# Patient Record
Sex: Female | Born: 1965 | Race: White | Hispanic: No | Smoking: Current every day smoker
Health system: Southern US, Community
[De-identification: ages and names within clinical notes are randomized; demographics above are authoritative.]

## PROBLEM LIST (undated history)

## (undated) DIAGNOSIS — N926 Irregular menstruation, unspecified: Secondary | ICD-10-CM

## (undated) DIAGNOSIS — Z8669 Personal history of other diseases of the nervous system and sense organs: Secondary | ICD-10-CM

## (undated) DIAGNOSIS — R6889 Other general symptoms and signs: Secondary | ICD-10-CM

## (undated) DIAGNOSIS — K219 Gastro-esophageal reflux disease without esophagitis: Secondary | ICD-10-CM

## (undated) HISTORY — DX: Irregular menstruation, unspecified: N92.6

## (undated) HISTORY — PX: TUBAL LIGATION: SHX77

## (undated) HISTORY — DX: Personal history of other diseases of the nervous system and sense organs: Z86.69

## (undated) HISTORY — DX: Gastro-esophageal reflux disease without esophagitis: K21.9

## (undated) HISTORY — DX: Other general symptoms and signs: R68.89

---

## 2004-01-14 ENCOUNTER — Ambulatory Visit (HOSPITAL_COMMUNITY): Admission: RE | Admit: 2004-01-14 | Discharge: 2004-01-14 | Payer: Self-pay | Admitting: Orthopaedic Surgery

## 2009-09-23 LAB — LIPID PANEL: LDL Cholesterol: 123 mg/dL

## 2009-09-23 LAB — BASIC METABOLIC PANEL: Creatinine: 1 mg/dL (ref <=1.1)

## 2009-09-30 ENCOUNTER — Encounter: Admission: RE | Admit: 2009-09-30 | Discharge: 2009-09-30 | Payer: Self-pay | Admitting: Family Medicine

## 2009-11-29 ENCOUNTER — Encounter: Admission: RE | Admit: 2009-11-29 | Discharge: 2009-11-29 | Payer: Self-pay | Admitting: Family Medicine

## 2009-12-15 LAB — CBC AND DIFFERENTIAL: WBC: 7.2 10^3/mL

## 2012-05-30 ENCOUNTER — Telehealth: Payer: Self-pay | Admitting: Family Medicine

## 2012-05-30 ENCOUNTER — Encounter: Payer: Self-pay | Admitting: Family Medicine

## 2012-05-30 ENCOUNTER — Ambulatory Visit (INDEPENDENT_AMBULATORY_CARE_PROVIDER_SITE_OTHER): Payer: BC Managed Care – PPO | Admitting: Family Medicine

## 2012-05-30 VITALS — BP 115/74 | HR 99 | Temp 98.1°F | Ht 65.0 in | Wt 154.0 lb

## 2012-05-30 DIAGNOSIS — Z8669 Personal history of other diseases of the nervous system and sense organs: Secondary | ICD-10-CM

## 2012-05-30 DIAGNOSIS — G43909 Migraine, unspecified, not intractable, without status migrainosus: Secondary | ICD-10-CM

## 2012-05-30 DIAGNOSIS — N926 Irregular menstruation, unspecified: Secondary | ICD-10-CM

## 2012-05-30 HISTORY — DX: Personal history of other diseases of the nervous system and sense organs: Z86.69

## 2012-05-30 HISTORY — DX: Irregular menstruation, unspecified: N92.6

## 2012-05-30 LAB — CBC
HCT: 37.7 % (ref 36.0–46.0)
MCH: 29.8 pg (ref 26.0–34.0)
MCHC: 34 g/dL (ref 30.0–36.0)
RDW: 12.9 % (ref 11.5–15.5)
WBC: 8.1 10*3/uL (ref 4.0–10.5)

## 2012-05-30 MED ORDER — PROMETHAZINE HCL 50 MG PO TABS: ORAL_TABLET | ORAL | Status: DC

## 2012-05-30 MED ORDER — KETOROLAC TROMETHAMINE 60 MG/2ML IM SOLN
60.0000 mg | Freq: Once | INTRAMUSCULAR | Status: AC
  Administered 2012-05-30: 60 mg via INTRAMUSCULAR

## 2012-05-30 NOTE — Telephone Encounter (Signed)
Taken care of

## 2012-05-30 NOTE — Progress Notes (Signed)
CC: Rebecca Shelton is a 46 y.o. female is here for Establish Care and Migraine   Subjective: HPI:  New patient presents to discuss headache. Headache is located in the left temporal region has been present since Tuesday of this week is identical headaches which she's had for the past years. She tells me she carries a diagnosis of recurrent migraines which she feels that this is a representation of her typical migraine. It is associated with nausea and photophobia. It improves and dark quiet environments in the past has responded to nonsteroidal anti-inflammatories. She experiences a squiggly line in her left peripheral vision minutes prior to all these headaches. She feels that the severity or frequency of his headaches have been worsening over the past months. The only change her lifestyle her health has included irregularity in the timing and volume(increased)Of her menses. She denies any shortness of breath, palpitations, chest pain. She denies any motor sensory disturbances prior during or after her headaches other than that described above. She denies fevers, chills, abdominal pain, bowel irregularities.   Review Of Systems Outlined In HPI  Past Medical History  Diagnosis Date  . Irregular menses 05/30/2012  . History of migraine 05/30/2012     Family History  Problem Relation Age of Onset  . Hypertension Mother   . Hypertension Father   . Diabetes Mother   . Heart attack Father      History  Substance Use Topics  . Smoking status: Current Everyday Smoker  . Smokeless tobacco: Not on file   Comment: 3 cigs a day  . Alcohol Use: No     Objective: Filed Vitals:   05/30/12 1054  BP: 115/74  Pulse: 99  Temp: 98.1 F (36.7 C)    General: Alert and Oriented, No Acute Distress HEENT: Pupils equal, round, reactive to light. Conjunctivae clear.  External ears unremarkable, canals clear with intact TMs with appropriate landmarks.  Middle ear appears open without effusion. Pink  inferior turbinates.  Moist mucous membranes, pharynx without inflammation nor lesions.  Neck supple without palpable lymphadenopathy nor abnormal masses. Lungs: Clear to auscultation bilaterally, no wheezing/ronchi/rales.  Comfortable work of breathing. Good air movement. Cardiac: Regular rate and rhythm. Normal S1/S2.  No murmurs, rubs, nor gallops.   Neuro: CN II-XII grossly intact, full strength/rom of all four extremities, C5/L4/S1 DTRs 2/4 bilaterally, gait normal, rapid alternating movements normal, heel-shin test normal, Rhomberg normal. Extremities: No peripheral edema.  Strong peripheral pulses.  Mental Status: No depression, anxiety, nor agitation. Skin: Warm and dry.  Assessment & Plan: Ilya was seen today for establish care and migraine.  Diagnoses and associated orders for this visit:  Migraine - promethazine (PHENERGAN) 50 MG tablet; One by mouth as needed for nausea or at the onset of migraine. - ketorolac (TORADOL) injection 60 mg; Inject 2 mLs (60 mg total) into the muscle once.  History of migraine  Irregular menses - CBC  Other Orders - Naproxen Sodium (ALEVE PO); Take by mouth.    Will rule out anemia as of contusion factor to your migraine, vision screening unremarkable. Discussed abortive therapies for her migraine including promethazine and Naprosyn at the onset of her aura or headache. Ketorolac in hopes of breaking her headache today.Signs and symptoms requring emergent/urgent reevaluation were discussed with the patient. I've asked her to return if the above therapy is unsuccessful or if she begins to have more than 4-6 migraines a month.  Return if symptoms worsen or fail to improve.  Requested Prescriptions  Signed Prescriptions Disp Refills  . promethazine (PHENERGAN) 50 MG tablet 45 tablet 1    Sig: One by mouth as needed for nausea or at the onset of migraine.

## 2012-05-30 NOTE — Telephone Encounter (Signed)
Pharmacy [facility] called and said they need a frequency on med that was sent in today for patient. [phone]... Thanks

## 2012-06-05 ENCOUNTER — Encounter: Payer: Self-pay | Admitting: Family Medicine

## 2012-06-05 DIAGNOSIS — Z86018 Personal history of other benign neoplasm: Secondary | ICD-10-CM | POA: Insufficient documentation

## 2012-06-05 DIAGNOSIS — L52 Erythema nodosum: Secondary | ICD-10-CM | POA: Insufficient documentation

## 2013-10-31 ENCOUNTER — Other Ambulatory Visit (HOSPITAL_COMMUNITY)
Admission: RE | Admit: 2013-10-31 | Discharge: 2013-10-31 | Disposition: A | Discharge status: Home / Self Care | Payer: BC Managed Care – PPO | Source: Ambulatory Visit | Attending: Family Medicine | Admitting: Family Medicine

## 2013-10-31 ENCOUNTER — Other Ambulatory Visit: Payer: Self-pay

## 2013-10-31 DIAGNOSIS — Z0000 Encounter for general adult medical examination without abnormal findings: Secondary | ICD-10-CM | POA: Insufficient documentation

## 2014-03-26 ENCOUNTER — Other Ambulatory Visit: Payer: Self-pay | Admitting: Gastroenterology

## 2014-09-24 ENCOUNTER — Ambulatory Visit (INDEPENDENT_AMBULATORY_CARE_PROVIDER_SITE_OTHER): Payer: BC Managed Care – PPO | Admitting: Podiatry

## 2014-09-24 ENCOUNTER — Ambulatory Visit (INDEPENDENT_AMBULATORY_CARE_PROVIDER_SITE_OTHER): Payer: BC Managed Care – PPO

## 2014-09-24 ENCOUNTER — Encounter: Payer: Self-pay | Admitting: Podiatry

## 2014-09-24 VITALS — BP 128/82 | HR 75 | Resp 18

## 2014-09-24 DIAGNOSIS — M722 Plantar fascial fibromatosis: Secondary | ICD-10-CM

## 2014-09-24 DIAGNOSIS — M79673 Pain in unspecified foot: Secondary | ICD-10-CM

## 2014-09-24 DIAGNOSIS — Q828 Other specified congenital malformations of skin: Secondary | ICD-10-CM

## 2014-09-24 NOTE — Progress Notes (Signed)
   Subjective:    Patient ID: Rebecca Shelton, female    DOB: 01/30/1966, 48 y.o.   MRN: [id number]  HPI  48 year old female presents the office today with complaints of a painful lesion on the ball of her right foot. She states that she is preserved been to several doctors he said it was a wart. She had seen a dermatologist who told her it was a callus. She states that she has pain over the area particularly with pressure or in the mornings. She denies any signs or symptoms of infection associated with the lesion. She has had no prior treatment Patient also states that she has a history of plantar fasciitis intimately gets pain to the left heel. She has appear custom orthotics which help alleviate her symptoms however there old and she is inquiring about getting a new pair. No other complaints at this time. Denies any recent injury or trauma to the area.    Review of Systems  All other systems reviewed and are negative.      Objective:   Physical Exam AAO 3, NAD DP/PT pulses palpable, CRT less than 3 seconds Protective sensation intact with Simms Weinstein monofilament, vibratory sensation intact, Achilles tendon reflex intact. Hyperkeratotic lesion  on the plantar metatarsal head on the right foot. There is slight discomfort upon palpation directly over the lesion however there is no areas of pinpoint bony tenderness or pain with vibratory sensation. Upon debridement of this lesion there is no pinpoint bleeding or evidence of verruca. At this time, there is no evidence to palpation overlying the plantar medial tubercle of the calcaneus at the insertion of plantar fascial or along the course of the plantar fascia to bilateral feet. There is no pain with lateral compression of the calcaneus or pain with vibratory sensation. No pain on the posterior aspect of the calcaneus or along the course last insertion of the Achilles tendon bilaterally.  No areas of pinpoint bony tenderness, or pain with  vibratory sensation. MMT 5/5, ROM WNL No open lesions or pre-ulcer lesions No pain with calf compression, swelling, warmth, erythema.      Assessment & Plan:  48 year old female with right submetatarsal porokeratosis; plantar fasciitis -X-rays were obtained and reviewed with the patient. -Treatment options were discussed including alternatives, risks, competitions. -Right foot hyperkeratotic lesion sharply debrided without complications. There is no evidence of verruca at this time. A donut pad was applied around the area followed by salinocaine and an occlusive dressing. A true care was discussed the patient is worse when to wash the area half and to monitor for any signs or symptoms of infection. -The plantar fasciitis currently the patient does not have symptoms however she does subjectively state intermittent when she gets pain to the bottom of her heel mostly on the left foot. She states that the pain is controlled with the orthotics have her current pair is worn out. She is requesting a new pair. The [facility]. Discussed stretching/icing activities. -Follow-up once the orthotics arise. In the meantime, encouraged to call the office with any question, concerns, change in symptoms.

## 2014-10-12 ENCOUNTER — Telehealth: Payer: Self-pay | Admitting: *Deleted

## 2014-10-12 NOTE — Telephone Encounter (Signed)
Patient stopped by the office to pick up her orthotics she has had them before so no appointment is necessary.  Pt will call if any questions or concerns.

## 2017-02-15 DIAGNOSIS — K219 Gastro-esophageal reflux disease without esophagitis: Secondary | ICD-10-CM | POA: Insufficient documentation

## 2017-02-15 DIAGNOSIS — E663 Overweight: Secondary | ICD-10-CM | POA: Insufficient documentation

## 2017-02-15 DIAGNOSIS — F17210 Nicotine dependence, cigarettes, uncomplicated: Secondary | ICD-10-CM | POA: Insufficient documentation

## 2017-06-05 ENCOUNTER — Ambulatory Visit: Payer: Self-pay | Admitting: Podiatry

## 2017-07-10 ENCOUNTER — Other Ambulatory Visit: Payer: Self-pay | Admitting: Nurse Practitioner

## 2017-07-10 DIAGNOSIS — Z1231 Encounter for screening mammogram for malignant neoplasm of breast: Secondary | ICD-10-CM

## 2017-07-13 ENCOUNTER — Ambulatory Visit
Admission: RE | Admit: 2017-07-13 | Discharge: 2017-07-13 | Disposition: A | Discharge status: Home / Self Care | Payer: BLUE CROSS/BLUE SHIELD | Source: Ambulatory Visit | Attending: Nurse Practitioner | Admitting: Nurse Practitioner

## 2017-07-13 DIAGNOSIS — Z1231 Encounter for screening mammogram for malignant neoplasm of breast: Secondary | ICD-10-CM

## 2017-07-17 ENCOUNTER — Other Ambulatory Visit: Payer: Self-pay | Admitting: Nurse Practitioner

## 2017-07-17 DIAGNOSIS — R928 Other abnormal and inconclusive findings on diagnostic imaging of breast: Secondary | ICD-10-CM

## 2017-07-24 ENCOUNTER — Ambulatory Visit
Admission: RE | Admit: 2017-07-24 | Discharge: 2017-07-24 | Disposition: A | Discharge status: Home / Self Care | Payer: BLUE CROSS/BLUE SHIELD | Source: Ambulatory Visit | Attending: Nurse Practitioner | Admitting: Nurse Practitioner

## 2017-07-24 DIAGNOSIS — R928 Other abnormal and inconclusive findings on diagnostic imaging of breast: Secondary | ICD-10-CM

## 2017-08-23 ENCOUNTER — Ambulatory Visit: Payer: Self-pay | Admitting: Podiatry

## 2017-08-31 ENCOUNTER — Ambulatory Visit (INDEPENDENT_AMBULATORY_CARE_PROVIDER_SITE_OTHER): Payer: BLUE CROSS/BLUE SHIELD

## 2017-08-31 ENCOUNTER — Ambulatory Visit (INDEPENDENT_AMBULATORY_CARE_PROVIDER_SITE_OTHER): Payer: BLUE CROSS/BLUE SHIELD | Admitting: Podiatry

## 2017-08-31 DIAGNOSIS — M659 Synovitis and tenosynovitis, unspecified: Secondary | ICD-10-CM | POA: Diagnosis not present

## 2017-08-31 DIAGNOSIS — M722 Plantar fascial fibromatosis: Secondary | ICD-10-CM | POA: Diagnosis not present

## 2017-08-31 DIAGNOSIS — M79673 Pain in unspecified foot: Secondary | ICD-10-CM | POA: Diagnosis not present

## 2017-08-31 MED ORDER — MELOXICAM 15 MG PO TABS: 15.0000 mg | ORAL_TABLET | Freq: Every day | ORAL | 2 refills | Status: DC

## 2017-09-05 NOTE — Progress Notes (Signed)
Subjective: Rebecca Shelton presents the office today for concerns of bilateral heel pain of the right side worse than left.  She states that she has been under the care of Dr. Elijah Birkom and she was given 2 injections 1 week apart and then after which she tore her plantar fascia and she was in the boot for 5 weeks before going back to work.  Now she is wearing steel toed shoes which is been more inserts which does help the left foot.  She still gets some discomfort in the arch of the right foot into the heel as well.  After she returned to her regular shoe she did not have any therapy or any further treatment.  She presents today to make sure that she is doing think she can to help prevent reoccurrence and help with her pain.  She denies any numbness or tingling in the pain does not wake her up at night.  Denies any claudication symptoms. Denies any systemic complaints such as fevers, chills, nausea, vomiting. No acute changes since last appointment, and no other complaints at this time.   Objective: AAO x3, NAD DP/PT pulses palpable bilaterally, CRT less than 3 seconds There is mild tenderness to palpation along the plantar medial tubercle of the calcaneus at the insertion of plantar fascia on the right > left foot. There is mild pain along the course of the plantar fascia within the arch of the foot on the right. Plantar fascia appears to be intact. There is no pain with lateral compression of the calcaneus or pain with vibratory sensation. There is no pain along the course or insertion of the achilles tendon. No other areas of tenderness to bilateral lower extremities. Negative Tinel sign. No open lesions or pre-ulcerative lesions.  No pain with calf compression, swelling, warmth, erythema  Assessment: Chronic bilateral heel, arch pain right side worse than left  Plan: -All treatment options discussed with the patient including all alternatives, risks, complications.  -X-rays were obtained and reviewed.  There is  no evidence of acute fracture identified. -At this time recommend to hold off on steroid injection she does not with another injection. -I do think that given her continued symptoms as well as history that she would benefit from physical therapy.  Will get [facility] set up for her.  Prescription was written for this today. -We discussed orthotics, custom inserts for shoes as well as shoe gear modifications. -Prescribed mobic. Discussed side effects of the medication and directed to stop if any are to occur and call the office.  -Follow-up in 4-6 weeks or sooner if needed -Patient encouraged to call the office with any questions, concerns, change in symptoms.   Vivi BarrackMatthew R Wagoner DPM

## 2017-09-12 DIAGNOSIS — M79676 Pain in unspecified toe(s): Secondary | ICD-10-CM

## 2017-09-28 ENCOUNTER — Ambulatory Visit: Payer: BLUE CROSS/BLUE SHIELD | Admitting: Podiatry

## 2017-10-18 ENCOUNTER — Ambulatory Visit: Payer: BLUE CROSS/BLUE SHIELD | Admitting: Podiatry

## 2017-11-05 ENCOUNTER — Encounter: Payer: Self-pay | Admitting: Podiatry

## 2017-11-05 ENCOUNTER — Ambulatory Visit (INDEPENDENT_AMBULATORY_CARE_PROVIDER_SITE_OTHER): Payer: BLUE CROSS/BLUE SHIELD | Admitting: Podiatry

## 2017-11-05 DIAGNOSIS — M722 Plantar fascial fibromatosis: Secondary | ICD-10-CM

## 2017-11-05 DIAGNOSIS — M79673 Pain in unspecified foot: Secondary | ICD-10-CM

## 2017-11-12 NOTE — Progress Notes (Signed)
Subjective: 52 year old female presents the office today for follow-up evaluation of bilateral foot pain.  She says the left side is doing very well.  She states that on the right foot she still gets pain this is mostly to the arch of the foot.  She states that she was on physical therapy twice a week and this is been helpful for her.  She stopped the meloxicam as it was not helping.  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  She states that since she tore the plantar fascia she has noticed that her arch has fallen somewhat.  She has no new concerns today. Denies any systemic complaints such as fevers, chills, nausea, vomiting. No acute changes since last appointment, and no other complaints at this time.   Objective: AAO x3, NAD DP/PT pulses palpable bilaterally, CRT less than 3 seconds On the right foot there is mild tenderness palpation on the medial band of plantar fascia with the arch of the foot.  There is no pain on the insertion into the calcaneus.  There is no pain with lateral compression of the calcaneus bilaterally.  There is no other area of tenderness identified bilaterally.  Is no overlying edema, erythema, increase in warmth.  There is negative Tinel sign bilaterally. No open lesions or pre-ulcerative lesions.  No pain with calf compression, swelling, warmth, erythema  Assessment: 52 year old female with arch pain right side, plantar fasciitis  Plan: -All treatment options discussed with the patient including all alternatives, risks, complications.  -She received the orthotics refurbished however I think a trial increased arch support in the right foot will be beneficial for her.  I have Raiford NobleRick evaluate her today for this as well.  We discussed other treatment options including EPAT, surgery, advanced injections however to continue with therapy as well as try to modify the orthotic to see  this be helpful for her.  She agrees with this plan. -Patient encouraged to call the office with any questions, concerns, change in symptoms.   Vivi BarrackMatthew R Adir Schicker DPM

## 2017-12-06 ENCOUNTER — Encounter: Payer: Self-pay | Admitting: Podiatry

## 2017-12-06 ENCOUNTER — Ambulatory Visit (INDEPENDENT_AMBULATORY_CARE_PROVIDER_SITE_OTHER): Payer: BLUE CROSS/BLUE SHIELD | Admitting: Podiatry

## 2017-12-06 DIAGNOSIS — M722 Plantar fascial fibromatosis: Secondary | ICD-10-CM | POA: Diagnosis not present

## 2017-12-06 DIAGNOSIS — M79673 Pain in unspecified foot: Secondary | ICD-10-CM | POA: Diagnosis not present

## 2017-12-06 DIAGNOSIS — M2062 Acquired deformities of toe(s), unspecified, left foot: Secondary | ICD-10-CM

## 2017-12-09 NOTE — Progress Notes (Signed)
Subjective: Rebecca Shelton presents the office today for follow-up evaluation of arch pain on the right side.  She states that she is doing a lot better since the orthotic on the right side.  She has minimal discomfort she denies any recent injury or swelling.  She has a new concern today for left second toe burning at times and is starting to turn going towards the big toe there is a split between second and third toe.  Denies any recent injury or trauma.  This is seen on the right foot is not as severe.  She has intermittent discomfort of the second toe.  No other concerns today. Denies any systemic complaints such as fevers, chills, nausea, vomiting. No acute changes since last appointment, and no other complaints at this time.   Objective: AAO x3, NAD DP/PT pulses palpable bilaterally, CRT less than 3 seconds At this time there is no tenderness palpation on the medial band plantar fascial the arch of the foot on the right side.  There is no area pinpoint bony tenderness identified the right side.  There is no overlying edema, erythema, increase in warmth. On the left foot there is splaying between the second and third toes.  There is no palpable neuroma identified.  She does get some occasional burning to the second toe.  There is no area pinpoint tenderness or pain to vibratory sensation. No open lesions or pre-ulcerative lesions.  No pain with calf compression, swelling, warmth, erythema  Assessment: Resolved right arch pain, plantar fasciitis; splitting of the second and third toes left foot  Plan: -All treatment options discussed with the patient including all alternatives, risks, complications.  -On the left foot it is reducible.  I did place a toe spacer between the big toe and the second toe and this helped hold the toe in a rectus position.  Unfortunately the toes are to go back straight and surgery.  Discussed that in the future if symptoms continue. -The right side is doing better with the  orthotic.  I want her to continue orthotics as well as supportive shoes.  Discussed continue with stretching, rehab exercises given the longevity of her symptoms to continue to rehabilitate this. -At this point I will follow-up with her as needed.  Call any questions or concerns and she agrees with this plan.  Vivi BarrackMatthew R Evonte Prestage DPM

## 2018-08-19 ENCOUNTER — Ambulatory Visit (INDEPENDENT_AMBULATORY_CARE_PROVIDER_SITE_OTHER): Payer: BLUE CROSS/BLUE SHIELD | Admitting: Neurology

## 2018-08-19 ENCOUNTER — Encounter: Payer: Self-pay | Admitting: Neurology

## 2018-08-19 ENCOUNTER — Encounter

## 2018-08-19 VITALS — BP 134/85 | HR 74 | Ht 65.0 in | Wt 168.0 lb

## 2018-08-19 DIAGNOSIS — G43709 Chronic migraine without aura, not intractable, without status migrainosus: Secondary | ICD-10-CM

## 2018-08-19 DIAGNOSIS — IMO0002 Reserved for concepts with insufficient information to code with codable children: Secondary | ICD-10-CM | POA: Insufficient documentation

## 2018-08-19 MED ORDER — ONDANSETRON 4 MG PO TBDP: 4.0000 mg | ORAL_TABLET | Freq: Three times a day (TID) | ORAL | 6 refills | Status: DC | PRN

## 2018-08-19 MED ORDER — TOPIRAMATE 100 MG PO TABS: 100.0000 mg | ORAL_TABLET | Freq: Two times a day (BID) | ORAL | 11 refills | Status: DC

## 2018-08-19 MED ORDER — RIZATRIPTAN BENZOATE 10 MG PO TBDP: 10.0000 mg | ORAL_TABLET | ORAL | 6 refills | Status: DC | PRN

## 2018-08-19 NOTE — Progress Notes (Signed)
PATIENT: Rebecca Shelton DOB: 06/24/1966  Chief Complaint  Patient presents with  . Migraine    Reports having at least two headache days per week.  She uses Advil helps but she has to repeat the dose every four hours.  Sometimes, her headache cycle will last up to four days.  She has never tried any preventive medications.  She has failed sumatriptan 50mg  tablets in the past.   . PCP    Rebecca Cutter, PA-C     HISTORICAL  Rebecca Shelton is a 52 years old female, seen in request by her primary care PA Rebecca Shelton, for evaluation of chronic migraine headache, initial evaluation was on August 19, 2018.  I have reviewed and summarized the referring note from the referring physician.  She had a past medical history of acid reflux, reported a history of migraine since her 30s, getting worse over the past few years, sometimes preceded by zigzag by visual aura, since 2017, she have headache at least 3-4 times each week, has been taking Advil frequently up to 20 tablets each day, her headache is severe lateralized pounding headache with associated light noise sensitivity, tried Imitrex in the past without significant help, she go to urgent care every couple months for Toradol shots, which opened helpful.  Sometimes her headache can last for 10 days.  She also reported few episode of sudden onset tunnel vision, with no significant headaches,  REVIEW OF SYSTEMS: Full 14 system review of systems performed and notable only for headache, snoring, decreased energy All other review of systems were negative.  ALLERGIES: No Known Allergies  HOME MEDICATIONS: Current Outpatient Medications  Medication Sig Dispense Refill  . esomeprazole (NEXIUM) 40 MG capsule Take 40 mg by mouth daily.   2  . ibuprofen (ADVIL,MOTRIN) 200 MG tablet Take 200 mg by mouth as needed.     No current facility-administered medications for this visit.     PAST MEDICAL HISTORY: Past Medical History:    Diagnosis Date  . Acid reflux   . History of migraine 05/30/2012  . Irregular menses 05/30/2012  . Suspected cervical cancer     PAST SURGICAL HISTORY: Past Surgical History:  Procedure Laterality Date  . TUBAL LIGATION      FAMILY HISTORY: Family History  Problem Relation Age of Onset  . Hypertension Mother   . Hyperlipidemia Mother   . Atrial fibrillation Mother   . Hypertension Father   . Heart attack Father     SOCIAL HISTORY: Social History   Socioeconomic History  . Marital status: Divorced    Spouse name: Not on file  . Number of children: 3  . Years of education: 70  . Highest education level: High school graduate  Occupational History  . Occupation: place orders  Social Needs  . Financial resource strain: Not on file  . Food insecurity:    Worry: Not on file    Inability: Not on file  . Transportation needs:    Medical: Not on file    Non-medical: Not on file  Tobacco Use  . Smoking status: Current Every Day Smoker    Types: Cigarettes  . Smokeless tobacco: Never Used  . Tobacco comment: 8-10 cigarettes per day  Substance and Sexual Activity  . Alcohol use: Yes    Comment: occasional use  . Drug use: No  . Sexual activity: Not on file  Lifestyle  . Physical activity:    Days per week: Not on file  Minutes per session: Not on file  . Stress: Not on file  Relationships  . Social connections:    Talks on phone: Not on file    Gets together: Not on file    Attends religious service: Not on file    Active member of club or organization: Not on file    Attends meetings of clubs or organizations: Not on file    Relationship status: Not on file  . Intimate partner violence:    Fear of current or ex partner: Not on file    Emotionally abused: Not on file    Physically abused: Not on file    Forced sexual activity: Not on file  Other Topics Concern  . Not on file  Social History Narrative   Lives at home with her husband.   One cup caffeine per  day.   Right-handed.     PHYSICAL EXAM   Vitals:   08/19/18 0739  BP: 134/85  Pulse: 74  Weight: 168 lb (76.2 kg)  Height: 5\' 5"  (1.651 m)    Not recorded      Body mass index is 27.96 kg/m.  PHYSICAL EXAMNIATION:  Gen: NAD, conversant, well nourised, obese, well groomed                     Cardiovascular: Regular rate rhythm, no peripheral edema, warm, nontender. Eyes: Conjunctivae clear without exudates or hemorrhage Neck: Supple, no carotid bruits. Pulmonary: Clear to auscultation bilaterally   NEUROLOGICAL EXAM:  MENTAL STATUS: Speech:    Speech is normal; fluent and spontaneous with normal comprehension.  Cognition:     Orientation to time, place and person     Normal recent and remote memory     Normal Attention span and concentration     Normal Language, naming, repeating,spontaneous speech     Fund of knowledge   CRANIAL NERVES: CN II: Visual fields are full to confrontation. Fundoscopic exam is normal with sharp discs and no vascular changes. Pupils are round equal and briskly reactive to light. CN III, IV, VI: extraocular movement are normal. No ptosis. CN V: Facial sensation is intact to pinprick in all 3 divisions bilaterally. Corneal responses are intact.  CN VII: Face is symmetric with normal eye closure and smile. CN VIII: Hearing is normal to rubbing fingers CN IX, X: Palate elevates symmetrically. Phonation is normal. CN XI: Head turning and shoulder shrug are intact CN XII: Tongue is midline with normal movements and no atrophy.  MOTOR: There is no pronator drift of out-stretched arms. Muscle bulk and tone are normal. Muscle strength is normal.  REFLEXES: Reflexes are 2+ and symmetric at the biceps, triceps, knees, and ankles. Plantar responses are flexor.  SENSORY: Intact to light touch, pinprick, positional sensation and vibratory sensation are intact in fingers and toes.  COORDINATION: Rapid alternating movements and fine finger  movements are intact. There is no dysmetria on finger-to-nose and heel-knee-shin.    GAIT/STANCE: Posture is normal. Gait is steady with normal steps, base, arm swing, and turning. Heel and toe walking are normal. Tandem gait is normal.  Romberg is absent.   DIAGNOSTIC DATA (LABS, IMAGING, TESTING) - I reviewed patient records, labs, notes, testing and imaging myself where available.   ASSESSMENT AND PLAN  Rebecca Shelton is a 52 y.o. female   Chronic migraine headaches with aura  With medicine rebound headache  Stop daily Advil use  Start preventive medication Topamax 100 mg titrating to twice a day  Maxalt as needed may mix together with Zofran, Aleve as needed  Levert FeinsteinYijun Yashar Inclan, M.D. Ph.D.  [facility] [address], [address] [place], [place] [address] Ph: [phone] Fax: [phone]  CC: Rebecca CutterBujanowski, Melissa, PA-C

## 2018-08-19 NOTE — Patient Instructions (Signed)
May take Maxalt Together with Zofran for nausea Aleve 1 to 2 tablets as needed for severe migraine headaches

## 2018-11-19 ENCOUNTER — Encounter: Payer: Self-pay | Admitting: Neurology

## 2018-11-19 ENCOUNTER — Ambulatory Visit (INDEPENDENT_AMBULATORY_CARE_PROVIDER_SITE_OTHER): Payer: BLUE CROSS/BLUE SHIELD | Admitting: Neurology

## 2018-11-19 VITALS — BP 117/75 | HR 81 | Ht 65.0 in | Wt 153.0 lb

## 2018-11-19 DIAGNOSIS — G43709 Chronic migraine without aura, not intractable, without status migrainosus: Secondary | ICD-10-CM | POA: Diagnosis not present

## 2018-11-19 DIAGNOSIS — IMO0002 Reserved for concepts with insufficient information to code with codable children: Secondary | ICD-10-CM

## 2018-11-19 MED ORDER — TOPIRAMATE 100 MG PO TABS: 100.0000 mg | ORAL_TABLET | Freq: Two times a day (BID) | ORAL | 4 refills | Status: DC

## 2018-11-19 NOTE — Progress Notes (Signed)
PATIENT: Rebecca Shelton DOB: 12/05/1965  Chief Complaint  Patient presents with  . Migraine    Reports improvement in migraines.  She estimates having one per month that resolves easily with her home meds.  Other than some weight loss, she is tolerating the Topamax well.      HISTORICAL  Rebecca Shelton is a 53 years old female, seen in request by her primary care PA [place], Melissa, for evaluation of chronic migraine headache, initial evaluation was on August 19, 2018.  I have reviewed and summarized the referring note from the referring physician.  She had a past medical history of acid reflux, reported a history of migraine since her 30s, getting worse over the past few years, sometimes preceded by zigzag by visual aura, since 2017, she have headache at least 3-4 times each week, has been taking Advil frequently up to 20 tablets each day, her headache is severe lateralized pounding headache with associated light noise sensitivity, tried Imitrex in the past without significant help, she go to urgent care every couple months for Toradol shots, which opened helpful.  Sometimes her headache can last for 10 days.  She also reported few episode of sudden onset tunnel vision, with no significant headaches,  UPDATE Nov 19 2018: Headache is much improved since starting Topamax 100 mg twice a day, she only has mild numbness tingling alternating of the taste overall tolerating it well, with some weight loss, Maxalt 10 mg as needed works well for her migraine, she only has migraine once a month  REVIEW OF SYSTEMS: Full 14 system review of systems performed and notable only for headache, snoring, decreased energy All other review of systems were negative.  ALLERGIES: No Known Allergies  HOME MEDICATIONS: Current Outpatient Medications  Medication Sig Dispense Refill  . esomeprazole (NEXIUM) 40 MG capsule Take 40 mg by mouth daily.   2  . naproxen sodium (ALEVE) 220 MG tablet Take 220 mg  by mouth as needed.    . ondansetron (ZOFRAN ODT) 4 MG disintegrating tablet Take 1 tablet (4 mg total) by mouth every 8 (eight) hours as needed. 20 tablet 6  . rizatriptan (MAXALT-MLT) 10 MG disintegrating tablet Take 1 tablet (10 mg total) by mouth as needed. May repeat in 2 hours if needed 15 tablet 6  . topiramate (TOPAMAX) 100 MG tablet Take 1 tablet (100 mg total) by mouth 2 (two) times daily. 60 tablet 11   No current facility-administered medications for this visit.     PAST MEDICAL HISTORY: Past Medical History:  Diagnosis Date  . Acid reflux   . History of migraine 05/30/2012  . Irregular menses 05/30/2012  . Suspected cervical cancer     PAST SURGICAL HISTORY: Past Surgical History:  Procedure Laterality Date  . TUBAL LIGATION      FAMILY HISTORY: Family History  Problem Relation Age of Onset  . Hypertension Mother   . Hyperlipidemia Mother   . Atrial fibrillation Mother   . Hypertension Father   . Heart attack Father     SOCIAL HISTORY: Social History   Socioeconomic History  . Marital status: Divorced    Spouse name: Not on file  . Number of children: 3  . Years of education: 30  . Highest education level: High school graduate  Occupational History  . Occupation: place orders  Social Needs  . Financial resource strain: Not on file  . Food insecurity:    Worry: Not on file    Inability:  Not on file  . Transportation needs:    Medical: Not on file    Non-medical: Not on file  Tobacco Use  . Smoking status: Current Every Day Smoker    Types: Cigarettes  . Smokeless tobacco: Never Used  . Tobacco comment: 8-10 cigarettes per day  Substance and Sexual Activity  . Alcohol use: Yes    Comment: occasional use  . Drug use: No  . Sexual activity: Not on file  Lifestyle  . Physical activity:    Days per week: Not on file    Minutes per session: Not on file  . Stress: Not on file  Relationships  . Social connections:    Talks on phone: Not on file     Gets together: Not on file    Attends religious service: Not on file    Active member of club or organization: Not on file    Attends meetings of clubs or organizations: Not on file    Relationship status: Not on file  . Intimate partner violence:    Fear of current or ex partner: Not on file    Emotionally abused: Not on file    Physically abused: Not on file    Forced sexual activity: Not on file  Other Topics Concern  . Not on file  Social History Narrative   Lives at home with her husband.   One cup caffeine per day.   Right-handed.     PHYSICAL EXAM   Vitals:   11/19/18 0723  BP: 117/75  Pulse: 81  Weight: 153 lb (69.4 kg)  Height: 5\' 5"  (1.651 m)    Not recorded      Body mass index is 25.46 kg/m.  PHYSICAL EXAMNIATION:  Gen: NAD, conversant, well nourised, obese, well groomed                     Cardiovascular: Regular rate rhythm, no peripheral edema, warm, nontender. Eyes: Conjunctivae clear without exudates or hemorrhage Neck: Supple, no carotid bruits. Pulmonary: Clear to auscultation bilaterally   NEUROLOGICAL EXAM:  MENTAL STATUS: Speech:    Speech is normal; fluent and spontaneous with normal comprehension.  Cognition:     Orientation to time, place and person     Normal recent and remote memory     Normal Attention span and concentration     Normal Language, naming, repeating,spontaneous speech     Fund of knowledge   CRANIAL NERVES: CN II: Visual fields are full to confrontation. Fundoscopic exam is normal with sharp discs and no vascular changes. Pupils are round equal and briskly reactive to light. CN III, IV, VI: extraocular movement are normal. No ptosis. CN V: Facial sensation is intact to pinprick in all 3 divisions bilaterally. Corneal responses are intact.  CN VII: Face is symmetric with normal eye closure and smile. CN VIII: Hearing is normal to rubbing fingers CN IX, X: Palate elevates symmetrically. Phonation is normal. CN XI:  Head turning and shoulder shrug are intact CN XII: Tongue is midline with normal movements and no atrophy.  MOTOR: There is no pronator drift of out-stretched arms. Muscle bulk and tone are normal. Muscle strength is normal.  REFLEXES: Reflexes are 2+ and symmetric at the biceps, triceps, knees, and ankles. Plantar responses are flexor.  SENSORY: Intact to light touch, pinprick, positional sensation and vibratory sensation are intact in fingers and toes.  COORDINATION: Rapid alternating movements and fine finger movements are intact. There is no dysmetria on finger-to-nose  and heel-knee-shin.    GAIT/STANCE: Posture is normal. Gait is steady with normal steps, base, arm swing, and turning. Heel and toe walking are normal. Tandem gait is normal.  Romberg is absent.   DIAGNOSTIC DATA (LABS, IMAGING, TESTING) - I reviewed patient records, labs, notes, testing and imaging myself where available.   ASSESSMENT AND PLAN  LAKEITHA HOBBS is a 53 y.o. female   Chronic migraine headaches with aura  She was able to successfully stop frequent over-the-counter medication use  Only has migraine headache once a month, Maxalt works well for him  I also advised her if her migraine headache continue to improve, she may taper down Topamax to lower dose, the other option would be over-the-counter preventive medication magnesium oxide 400 mg twice a day, riboflavin 100 mg twice a day,  Face to face time was 15 minutes, greater than 50% of the time was spent in counseling and coordination of care with the patient.    Levert Feinstein, M.D. Ph.D.  [facility] [address] [place], [place] [address] Ph: [phone] Fax: [phone]  CC: [name], PA-C

## 2018-12-20 DIAGNOSIS — N920 Excessive and frequent menstruation with regular cycle: Secondary | ICD-10-CM | POA: Insufficient documentation

## 2019-02-26 ENCOUNTER — Ambulatory Visit: Payer: BLUE CROSS/BLUE SHIELD | Admitting: Podiatry

## 2019-03-06 ENCOUNTER — Other Ambulatory Visit: Payer: Self-pay

## 2019-03-06 ENCOUNTER — Ambulatory Visit (INDEPENDENT_AMBULATORY_CARE_PROVIDER_SITE_OTHER): Payer: BLUE CROSS/BLUE SHIELD

## 2019-03-06 ENCOUNTER — Other Ambulatory Visit: Payer: Self-pay | Admitting: Podiatry

## 2019-03-06 ENCOUNTER — Ambulatory Visit (INDEPENDENT_AMBULATORY_CARE_PROVIDER_SITE_OTHER): Payer: BLUE CROSS/BLUE SHIELD | Admitting: Podiatry

## 2019-03-06 DIAGNOSIS — M778 Other enthesopathies, not elsewhere classified: Secondary | ICD-10-CM

## 2019-03-06 DIAGNOSIS — M779 Enthesopathy, unspecified: Secondary | ICD-10-CM | POA: Diagnosis not present

## 2019-03-06 DIAGNOSIS — M722 Plantar fascial fibromatosis: Secondary | ICD-10-CM

## 2019-03-06 MED ORDER — MELOXICAM 15 MG PO TABS: 15.0000 mg | ORAL_TABLET | Freq: Every day | ORAL | 0 refills | Status: DC

## 2019-03-06 MED ORDER — METHYLPREDNISOLONE 4 MG PO TBPK: ORAL_TABLET | ORAL | 0 refills | Status: DC

## 2019-03-06 NOTE — Patient Instructions (Signed)
Start with the medrol dose pack (steroid) and once finished then start the mobic.    Plantar Fasciitis (Heel Spur Syndrome) with Rehab The plantar fascia is a fibrous, ligament-like, soft-tissue structure that spans the bottom of the foot. Plantar fasciitis is a condition that causes pain in the foot due to inflammation of the tissue. SYMPTOMS   Pain and tenderness on the underneath side of the foot.  Pain that worsens with standing or walking. CAUSES  Plantar fasciitis is caused by irritation and injury to the plantar fascia on the underneath side of the foot. Common mechanisms of injury include:  Direct trauma to bottom of the foot.  Damage to a small nerve that runs under the foot where the main fascia attaches to the heel bone.  Stress placed on the plantar fascia due to bone spurs. RISK INCREASES WITH:   Activities that place stress on the plantar fascia (running, jumping, pivoting, or cutting).  Poor strength and flexibility.  Improperly fitted shoes.  Tight calf muscles.  Flat feet.  Failure to warm-up properly before activity.  Obesity. PREVENTION  Warm up and stretch properly before activity.  Allow for adequate recovery between workouts.  Maintain physical fitness:  Strength, flexibility, and endurance.  Cardiovascular fitness.  Maintain a health body weight.  Avoid stress on the plantar fascia.  Wear properly fitted shoes, including arch supports for individuals who have flat feet.  PROGNOSIS  If treated properly, then the symptoms of plantar fasciitis usually resolve without surgery. However, occasionally surgery is necessary.  RELATED COMPLICATIONS   Recurrent symptoms that may result in a chronic condition.  Problems of the lower back that are caused by compensating for the injury, such as limping.  Pain or weakness of the foot during push-off following surgery.  Chronic inflammation, scarring, and partial or complete fascia tear, occurring  more often from repeated injections.  TREATMENT  Treatment initially involves the use of ice and medication to help reduce pain and inflammation. The use of strengthening and stretching exercises may help reduce pain with activity, especially stretches of the Achilles tendon. These exercises may be performed at home or with a therapist. Your caregiver may recommend that you use heel cups of arch supports to help reduce stress on the plantar fascia. Occasionally, corticosteroid injections are given to reduce inflammation. If symptoms persist for greater than 6 months despite non-surgical (conservative), then surgery may be recommended.   MEDICATION   If pain medication is necessary, then nonsteroidal anti-inflammatory medications, such as aspirin and ibuprofen, or other minor pain relievers, such as acetaminophen, are often recommended.  Do not take pain medication within 7 days before surgery.  Prescription pain relievers may be given if deemed necessary by your caregiver. Use only as directed and only as much as you need.  Corticosteroid injections may be given by your caregiver. These injections should be reserved for the most serious cases, because they may only be given a certain number of times.  HEAT AND COLD  Cold treatment (icing) relieves pain and reduces inflammation. Cold treatment should be applied for 10 to 15 minutes every 2 to 3 hours for inflammation and pain and immediately after any activity that aggravates your symptoms. Use ice packs or massage the area with a piece of ice (ice massage).  Heat treatment may be used prior to performing the stretching and strengthening activities prescribed by your caregiver, physical therapist, or athletic trainer. Use a heat pack or soak the injury in warm water.  SEEK IMMEDIATE  MEDICAL CARE IF:  Treatment seems to offer no benefit, or the condition worsens.  Any medications produce adverse side effects.  EXERCISES- RANGE OF MOTION  (ROM) AND STRETCHING EXERCISES - Plantar Fasciitis (Heel Spur Syndrome) These exercises may help you when beginning to rehabilitate your injury. Your symptoms may resolve with or without further involvement from your physician, physical therapist or athletic trainer. While completing these exercises, remember:   Restoring tissue flexibility helps normal motion to return to the joints. This allows healthier, less painful movement and activity.  An effective stretch should be held for at least 30 seconds.  A stretch should never be painful. You should only feel a gentle lengthening or release in the stretched tissue.  RANGE OF MOTION - Toe Extension, Flexion  Sit with your right / left leg crossed over your opposite knee.  Grasp your toes and gently pull them back toward the top of your foot. You should feel a stretch on the bottom of your toes and/or foot.  Hold this stretch for 10 seconds.  Now, gently pull your toes toward the bottom of your foot. You should feel a stretch on the top of your toes and or foot.  Hold this stretch for 10 seconds. Repeat  times. Complete this stretch 3 times per day.   RANGE OF MOTION - Ankle Dorsiflexion, Active Assisted  Remove shoes and sit on a chair that is preferably not on a carpeted surface.  Place right / left foot under knee. Extend your opposite leg for support.  Keeping your heel down, slide your right / left foot back toward the chair until you feel a stretch at your ankle or calf. If you do not feel a stretch, slide your bottom forward to the edge of the chair, while still keeping your heel down.  Hold this stretch for 10 seconds. Repeat 3 times. Complete this stretch 2 times per day.   STRETCH  Gastroc, Standing  Place hands on wall.  Extend right / left leg, keeping the front knee somewhat bent.  Slightly point your toes inward on your back foot.  Keeping your right / left heel on the floor and your knee straight, shift your  weight toward the wall, not allowing your back to arch.  You should feel a gentle stretch in the right / left calf. Hold this position for 10 seconds. Repeat 3 times. Complete this stretch 2 times per day.  STRETCH  Soleus, Standing  Place hands on wall.  Extend right / left leg, keeping the other knee somewhat bent.  Slightly point your toes inward on your back foot.  Keep your right / left heel on the floor, bend your back knee, and slightly shift your weight over the back leg so that you feel a gentle stretch deep in your back calf.  Hold this position for 10 seconds. Repeat 3 times. Complete this stretch 2 times per day.  STRETCH  Gastrocsoleus, Standing  Note: This exercise can place a lot of stress on your foot and ankle. Please complete this exercise only if specifically instructed by your caregiver.   Place the ball of your right / left foot on a step, keeping your other foot firmly on the same step.  Hold on to the wall or a rail for balance.  Slowly lift your other foot, allowing your body weight to press your heel down over the edge of the step.  You should feel a stretch in your right / left calf.  Hold  this position for 10 seconds.  Repeat this exercise with a slight bend in your right / left knee. Repeat 3 times. Complete this stretch 2 times per day.   STRENGTHENING EXERCISES - Plantar Fasciitis (Heel Spur Syndrome)  These exercises may help you when beginning to rehabilitate your injury. They may resolve your symptoms with or without further involvement from your physician, physical therapist or athletic trainer. While completing these exercises, remember:   Muscles can gain both the endurance and the strength needed for everyday activities through controlled exercises.  Complete these exercises as instructed by your physician, physical therapist or athletic trainer. Progress the resistance and repetitions only as guided.  STRENGTH - Towel Curls  Sit in a  chair positioned on a non-carpeted surface.  Place your foot on a towel, keeping your heel on the floor.  Pull the towel toward your heel by only curling your toes. Keep your heel on the floor. Repeat 3 times. Complete this exercise 2 times per day.  STRENGTH - Ankle Inversion  Secure one end of a rubber exercise band/tubing to a fixed object (table, pole). Loop the other end around your foot just before your toes.  Place your fists between your knees. This will focus your strengthening at your ankle.  Slowly, pull your big toe up and in, making sure the band/tubing is positioned to resist the entire motion.  Hold this position for 10 seconds.  Have your muscles resist the band/tubing as it slowly pulls your foot back to the starting position. Repeat 3 times. Complete this exercises 2 times per day.  Document Released: 09/11/2005 Document Revised: 12/04/2011 Document Reviewed: 12/24/2008 [facility] Patient Information 2014 [place]Care, [place].

## 2019-03-10 ENCOUNTER — Ambulatory Visit: Payer: BLUE CROSS/BLUE SHIELD | Admitting: Podiatry

## 2019-03-13 NOTE — Progress Notes (Signed)
Subjective: 53 year old female presents the office today for concerns of bilateral foot pain which is been ongoing for last 2 weeks with the right side worse than left.  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  She has a history of plantar fasciitis but she states it feels different.  She feels okay in the morning the pain is worse during the day after she been on her feet.  She does wear steel toed shoes.  She still points to the arch of the foot where she gets discomfort.  No recent injury or falls.  No swelling.  No numbness or tingling. Denies any systemic complaints such as fevers, chills, nausea, vomiting. No acute changes since last appointment, and no other complaints at this time.   Objective: AAO x3, NAD DP/PT pulses palpable bilaterally, CRT less than 3 seconds There is tenderness palpation along the medial plantar fascia in the arch of the foot.  Plantar fascial appears to be intact.  No pain to the dorsal foot.  No area pinpoint tenderness.  There is no pain with lateral compression of calcaneus.  Achilles tendon appears to be intact. No open lesions or pre-ulcerative lesions.  No pain with calf compression, swelling, warmth, erythema  Assessment: Bilateral arch pain, chronic issue  Plan: -All treatment options discussed with the patient including all alternatives, risks, complications.  -Medrol Dosepak prescribed.  Once this is complete can start meloxicam.  Discussed side effects. -Continue stretching, icing daily. -She does have orthotics.  I do think she needs to get some new ones as well.  I will have her follow-up with Liliane Channel for inserts I see her back. -Patient encouraged to call the office with any questions, concerns, change in symptoms.   Trula Slade DPM

## 2019-04-09 ENCOUNTER — Ambulatory Visit: Payer: BLUE CROSS/BLUE SHIELD | Admitting: Podiatry

## 2019-04-21 ENCOUNTER — Other Ambulatory Visit: Payer: Self-pay

## 2019-04-21 ENCOUNTER — Ambulatory Visit (INDEPENDENT_AMBULATORY_CARE_PROVIDER_SITE_OTHER): Payer: BC Managed Care – PPO | Admitting: Podiatry

## 2019-04-21 ENCOUNTER — Ambulatory Visit (INDEPENDENT_AMBULATORY_CARE_PROVIDER_SITE_OTHER): Payer: Self-pay | Admitting: Orthotics

## 2019-04-21 DIAGNOSIS — M79673 Pain in unspecified foot: Secondary | ICD-10-CM | POA: Diagnosis not present

## 2019-04-21 DIAGNOSIS — M722 Plantar fascial fibromatosis: Secondary | ICD-10-CM | POA: Diagnosis not present

## 2019-04-25 NOTE — Progress Notes (Signed)
Subjective: 53 year old female presents the office today for follow-up evaluation of right foot plantar fasciitis.  She states that she about 40% better but still having discomfort.  She also gets an occasional discomfort in the left side but not as bad as the right.  She denies any recent injury.  She did Medrol Dosepak and she has been on the meloxicam.  She still doing the stretching, icing daily. Denies any systemic complaints such as fevers, chills, nausea, vomiting. No acute changes since last appointment, and no other complaints at this time.   Objective: AAO x3, NAD DP/PT pulses palpable bilaterally, CRT less than 3 seconds There is still tenderness palpation present on the right foot on medial band of the arch of the foot.  She does have a history of a tear plantar fascia on that side is not as tight as the left side but appears to be intact otherwise.  This is been a chronic issue for her.  There is no edema, erythema.  There is no area pinpoint tenderness there is no pain with lateral compression of calcaneus. No open lesions or pre-ulcerative lesions.  No pain with calf compression, swelling, warmth, erythema  Assessment: Right side plantar fasciitis with history of plantar fascial tear  Plan: -All treatment options discussed with the patient including all alternatives, risks, complications.  -Plantar fascial tapings applied today.  Liliane Channel evaluated her today she is noted new orthotics.  Continue stretching, icing.  Meloxicam as needed. -Will start EPAT at her convience as well. Consider taping after EPAT.  -Patient encouraged to call the office with any questions, concerns, change in symptoms.   Trula Slade DPM

## 2019-04-29 ENCOUNTER — Other Ambulatory Visit: Payer: Self-pay

## 2019-04-29 ENCOUNTER — Ambulatory Visit: Payer: BC Managed Care – PPO | Admitting: Podiatry

## 2019-04-29 DIAGNOSIS — M722 Plantar fascial fibromatosis: Secondary | ICD-10-CM

## 2019-05-06 ENCOUNTER — Ambulatory Visit: Payer: BC Managed Care – PPO

## 2019-05-06 ENCOUNTER — Other Ambulatory Visit: Payer: Self-pay

## 2019-05-06 DIAGNOSIS — M722 Plantar fascial fibromatosis: Secondary | ICD-10-CM

## 2019-05-06 DIAGNOSIS — M79673 Pain in unspecified foot: Secondary | ICD-10-CM

## 2019-05-06 NOTE — Progress Notes (Signed)

## 2019-05-13 ENCOUNTER — Other Ambulatory Visit: Payer: Self-pay

## 2019-05-13 ENCOUNTER — Ambulatory Visit: Payer: BC Managed Care – PPO | Admitting: Podiatry

## 2019-05-13 DIAGNOSIS — M722 Plantar fascial fibromatosis: Secondary | ICD-10-CM

## 2019-05-13 NOTE — Patient Instructions (Signed)

## 2019-05-27 ENCOUNTER — Ambulatory Visit: Payer: BC Managed Care – PPO

## 2019-05-27 ENCOUNTER — Other Ambulatory Visit: Payer: Self-pay

## 2019-05-27 DIAGNOSIS — M722 Plantar fascial fibromatosis: Secondary | ICD-10-CM

## 2019-05-30 NOTE — Progress Notes (Signed)
Patient is here today with complaint of chronic right foot pain.  She states the pain is in her heel and has been ongoing for several months, she has tried injections, medication, anti-inflammatories, stretching, and icing daily.  She is not had any relief so far and wants to try shockwave therapy.  She states that she has noticed a slight difference in her pain, but she still works on concrete floors, and does a lot of walking, so the pain persists.  ESWT administered to right heel for 7 J and tolerated well.  She is to follow-up next week for3rdtreatment.

## 2019-05-30 NOTE — Progress Notes (Signed)
Patient is here today with complaint of chronic right foot pain.  She states the pain is in her heel and has been ongoing for several months, she has tried injections, medication, anti-inflammatories, stretching, and icing daily.  She is not had any relief so far and wants to try shockwave therapy.  ESWT administered to right heel for 5 J and tolerated well.  She is to follow-up next week for second treatment.

## 2019-06-11 ENCOUNTER — Ambulatory Visit: Payer: BC Managed Care – PPO

## 2019-06-11 ENCOUNTER — Other Ambulatory Visit: Payer: Self-pay

## 2019-06-11 DIAGNOSIS — M722 Plantar fascial fibromatosis: Secondary | ICD-10-CM

## 2019-06-20 NOTE — Progress Notes (Signed)
Patient is here today with complaint of chronic right foot pain.  She states the pain is in her heel and has been ongoing for several months, she has tried injections, medication, anti-inflammatories, stretching, and icing daily.  She is not had any relief so far and wants to try shockwave therapy.  She states that she has noticed a slight difference in her pain, but she still works on concrete floors, and does a lot of walking, so the pain persists.  ESWT administered to right heel for 8 J and tolerated well.  She is to follow-up next week for 3rdtreatment.

## 2019-06-20 NOTE — Progress Notes (Signed)
Patient is here today with complaint of chronic right foot pain.  She states the pain is in her heel and has been ongoing for several months, she has tried injections, medication, anti-inflammatories, stretching, and icing daily.  She is not had any relief so far and wants to try shockwave therapy.  She states that she has noticed a slight difference in her pain, but she still works on concrete floors, and does a lot of walking, so the pain persists.  ESWT administered to right heel for 9 J and tolerated well.  She is to follow-up next week for 4th treatment.

## 2019-06-30 ENCOUNTER — Ambulatory Visit: Payer: BC Managed Care – PPO

## 2019-06-30 ENCOUNTER — Encounter (INDEPENDENT_AMBULATORY_CARE_PROVIDER_SITE_OTHER): Payer: Self-pay

## 2019-06-30 ENCOUNTER — Other Ambulatory Visit: Payer: Self-pay

## 2019-06-30 DIAGNOSIS — M722 Plantar fascial fibromatosis: Secondary | ICD-10-CM

## 2019-07-01 NOTE — Progress Notes (Signed)
Patient is here today with complaint of chronic right foot pain.  She states the pain is in her heel and has been ongoing for several months, she has tried injections, medication, anti-inflammatories, stretching, and icing daily.  She is not had any relief so far and wants to try shockwave therapy.  She states that she has noticed a slight difference in her pain, but she still works on concrete floors, and does a lot of walking, so the pain persists.  ESWT administered to right heel for 10 J and tolerated well.  She is to follow-up next week for 5th treatment.

## 2019-07-03 NOTE — Progress Notes (Signed)
Patient is here today with complaint of chronic right foot pain.  She states the pain is in her heel and has been ongoing for several months, she has tried injections, medication, anti-inflammatories, stretching, and icing daily.  She is not had any relief so far and wants to try shockwave therapy.  She states that she has noticed a slight difference in her pain, but she still works on concrete floors, and does a lot of walking, so the pain persists. She states that she is noticing an improvement in her pain, the pain is not as sharp and intense as it was before.  ESWT administered to right heel for 10 J and tolerated well.  She is to follow-up in 2  week for 6th treatment.

## 2019-08-04 ENCOUNTER — Ambulatory Visit (INDEPENDENT_AMBULATORY_CARE_PROVIDER_SITE_OTHER): Payer: BC Managed Care – PPO | Admitting: Podiatry

## 2019-08-04 ENCOUNTER — Other Ambulatory Visit: Payer: Self-pay

## 2019-08-04 DIAGNOSIS — M722 Plantar fascial fibromatosis: Secondary | ICD-10-CM | POA: Diagnosis not present

## 2019-08-04 NOTE — Patient Instructions (Signed)

## 2019-08-05 DIAGNOSIS — M722 Plantar fascial fibromatosis: Secondary | ICD-10-CM | POA: Insufficient documentation

## 2019-08-05 NOTE — Progress Notes (Signed)
Subjective: 53 year old female presents the office today for follow-up evaluation of right foot plantar fasciitis, plantar fascial tear.  She states that the EPAT treatment has been very helpful for her.  She has some occasional basis and discomfort but overall she is significantly improved.  No swelling or any radiating pain.  No recent injury. Denies any systemic complaints such as fevers, chills, nausea, vomiting. No acute changes since last appointment, and no other complaints at this time.   Objective: AAO x3, NAD DP/PT pulses palpable bilaterally, CRT less than 3 seconds There is no tenderness palpation along the course or insertion of the plantar fascia.  No pain Achilles tendon.  There is no area pinpoint tenderness there is no edema, erythema.  Negative Tinel sign. No pain with calf compression, swelling, warmth, erythema  Assessment:  53 year old female right plantar fasciitis with much improved  Plan: -All treatment options discussed with the patient including all alternatives, risks, complications.  -Overall is pretty much resolved at this point.  She still has some equinus.  Discussed with her continue with stretching, rehab exercises at home as well as continue with supportive shoes, orthotics. -Patient encouraged to call the office with any questions, concerns, change in symptoms.   Follow-up as needed  Trula Slade DPM

## 2019-08-31 ENCOUNTER — Other Ambulatory Visit: Payer: Self-pay | Admitting: Neurology

## 2019-09-09 IMAGING — MG 2D DIGITAL DIAGNOSTIC UNILATERAL RIGHT MAMMOGRAM WITH CAD AND AD
8 of 12 series · 8 of 28 positions shown · non-contrast
Comparison: Mammography 07/13/2017, 11/29/2009.

CLINICAL DATA: Recall from 2D screening mammography, focal
asymmetry involving the upper right breast at posterior depth.

EXAM:
2D DIGITAL DIAGNOSTIC RIGHT MAMMOGRAM WITH CAD AND ADJUNCT TOMO
ULTRASOUND RIGHT BREAST

[R CC synth-2D (1 of 2)]
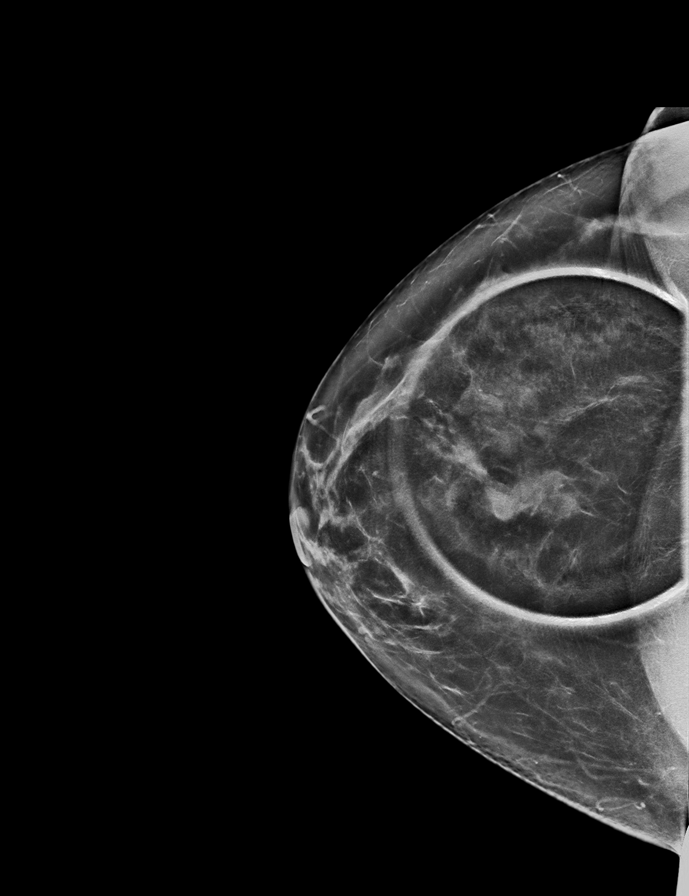

[R MLO synth-2D (1 of 2)]
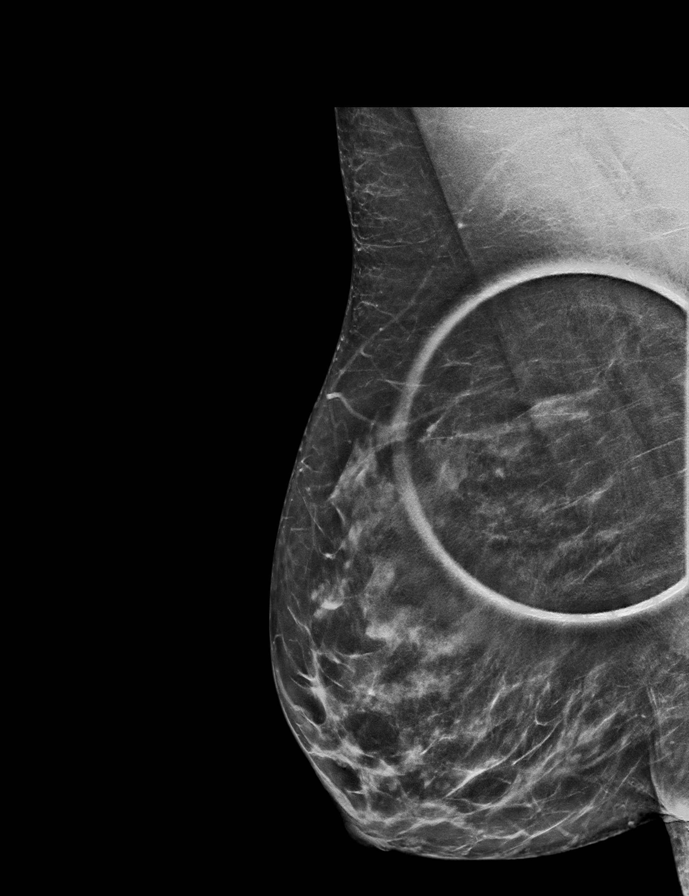

[R MLO synth-2D (2 of 2)]
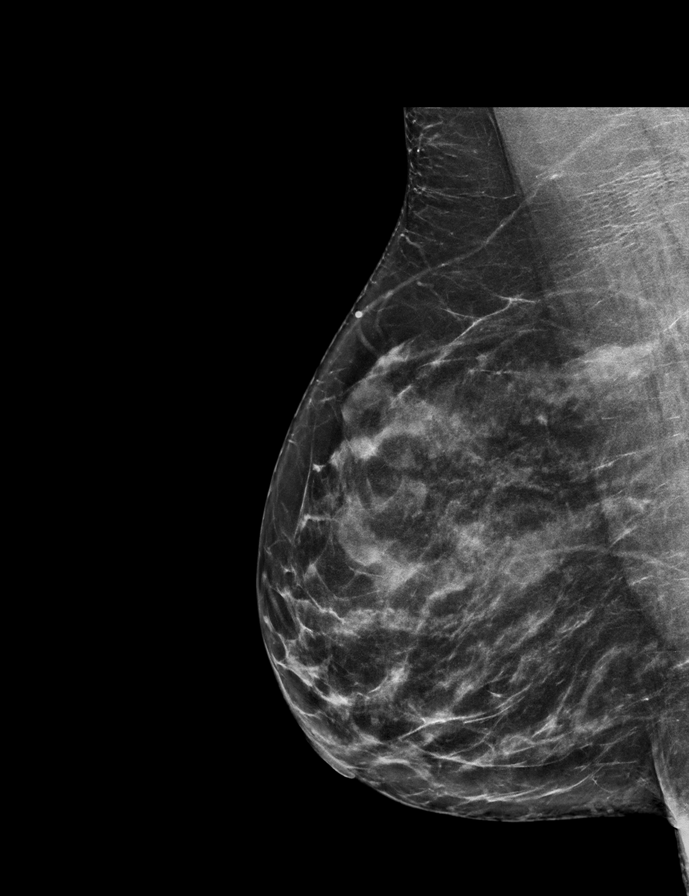

[R CC (1 of 2)]
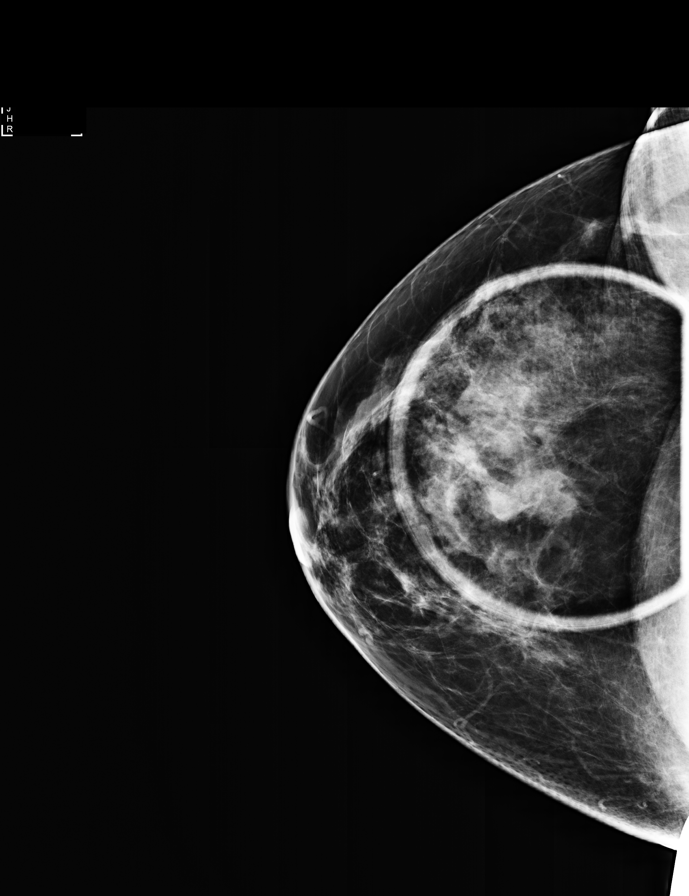

[R MLO (1 of 2)]
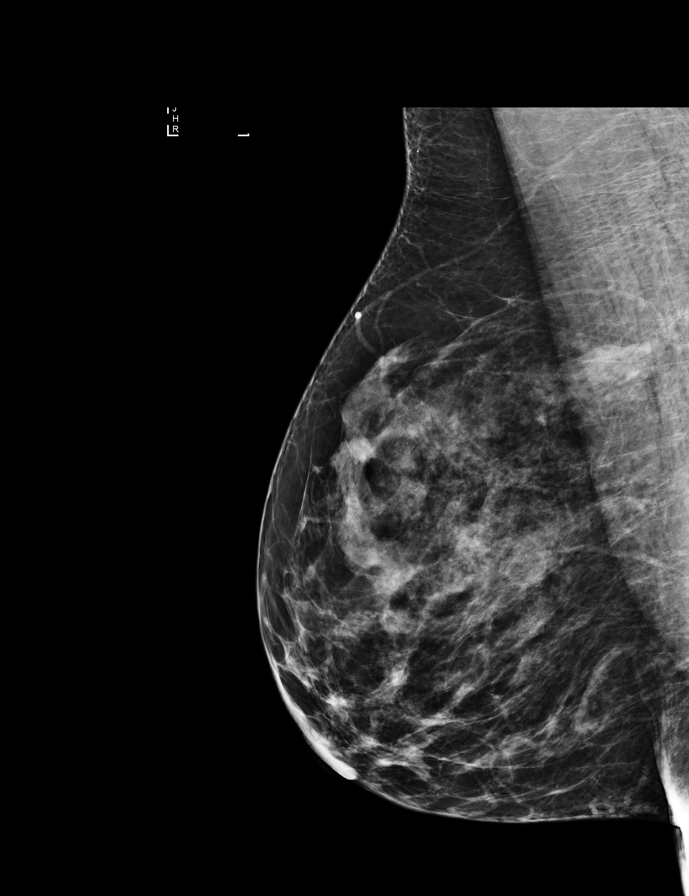

[R MLO (2 of 2)]
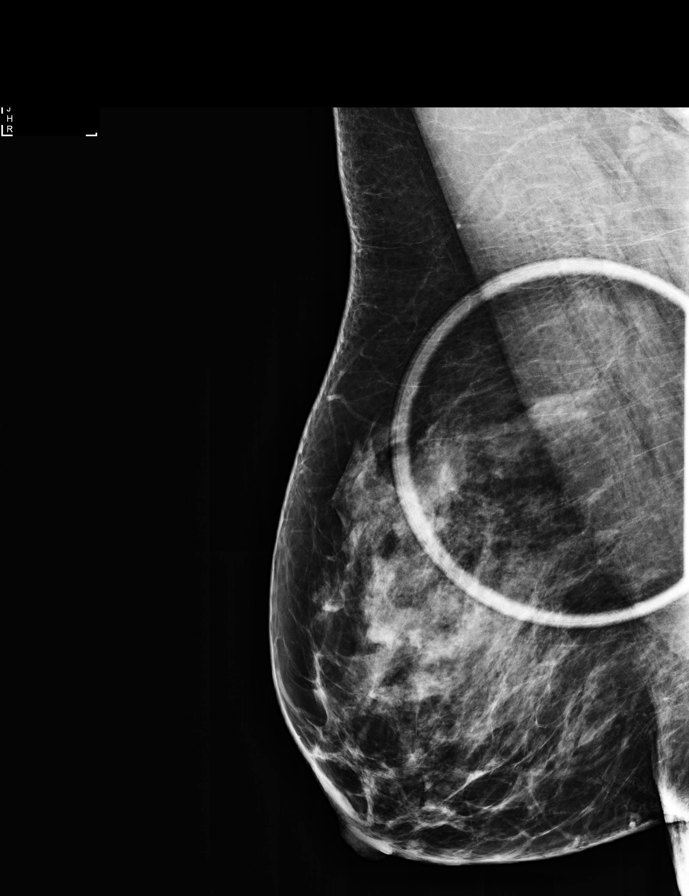

[R CC (2 of 2)]
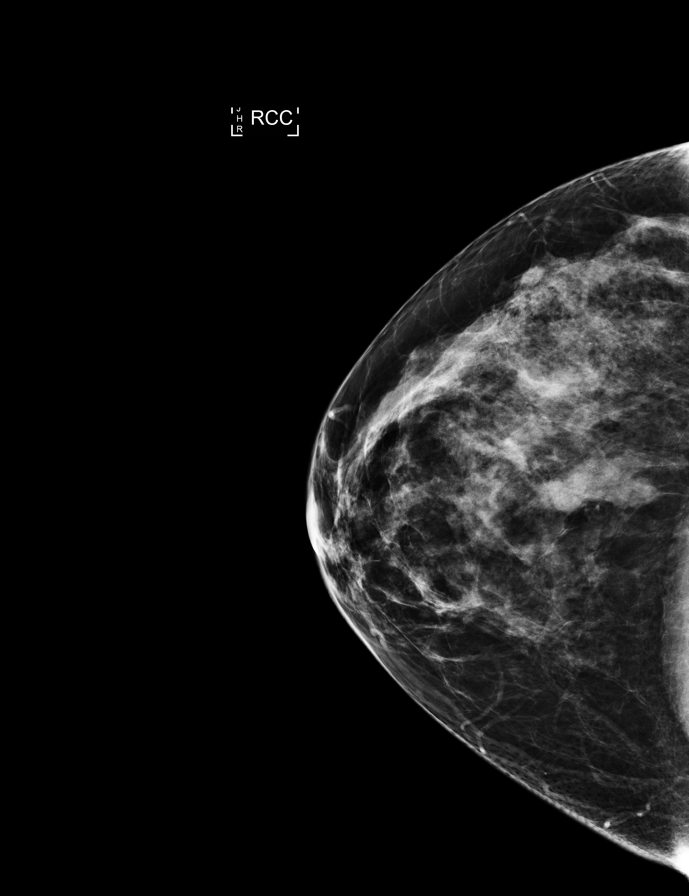

[R CC synth-2D (2 of 2)]
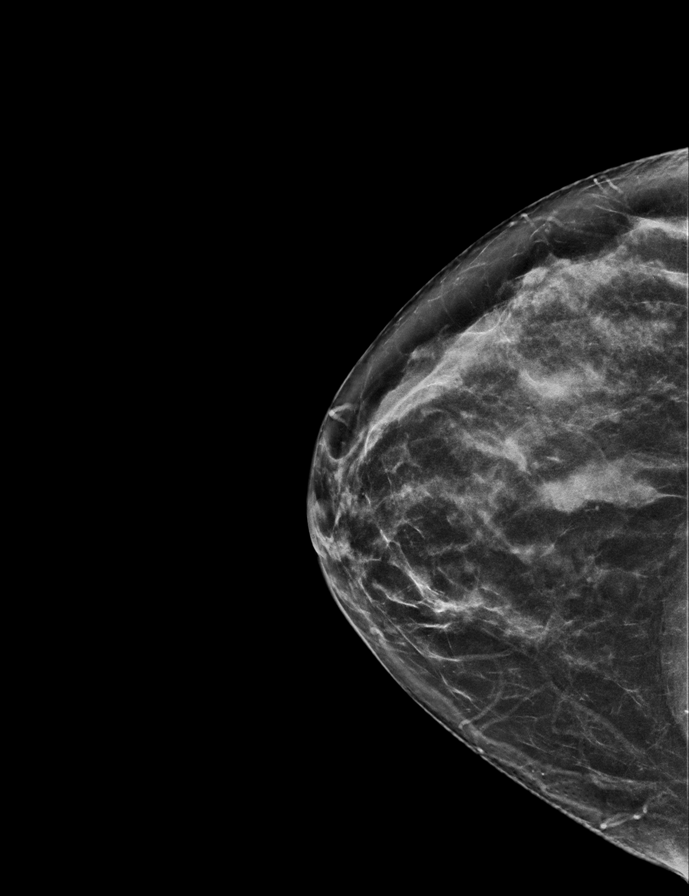

[8 of 28 positions shown; findings below may reference images not displayed]

No prior
ultrasound.

ACR Breast Density Category c: The breast tissue is heterogeneously
dense, which may obscure small masses.
FINDINGS: Standard 2D and tomosynthesis full field CC and MLO views of the
right breast were obtained. A standard and tomosynthesis spot
tangential CC and MLO view of the area of concern on screening
mammography was also obtained.

The focal asymmetry in the upper right breast at far posterior depth
disperses with compression. There is no underlying mass or
architectural distortion. No suspicious findings elsewhere in the
right breast on the full field tomosynthesis images.

Mammographic images were processed with CAD.

On physical exam, there is no palpable mass in the upper right
breast at posterior depth.

Targeted right breast ultrasound is performed, showing an island of
normal fibroglandular tissue at the 12 o'clock position
approximately 7 cm from the nipple at posterior depth, adjacent to
chest wall/pectoralis muscle, corresponding to the screening
mammographic finding. No cyst, solid mass or abnormal acoustic
shadowing is identified.
IMPRESSION: No mammographic or sonographic evidence of malignancy involving the
right breast. The screening mammographic finding corresponds to
isolated focally dense normal fibroglandular tissue.

RECOMMENDATION:
Screening mammogram in one year.(Code:O0-E-NS4)

I have discussed the findings and recommendations with the patient.
Results were also provided in writing at the conclusion of the
visit. If applicable, a reminder letter will be sent to the patient
regarding the next appointment.

BI-RADS CATEGORY  1: Negative.

## 2019-09-13 ENCOUNTER — Other Ambulatory Visit: Payer: Self-pay | Admitting: Neurology

## 2019-12-02 ENCOUNTER — Other Ambulatory Visit: Payer: Self-pay | Admitting: Neurology

## 2020-01-09 ENCOUNTER — Other Ambulatory Visit: Payer: Self-pay | Admitting: Neurology

## 2020-02-20 ENCOUNTER — Other Ambulatory Visit: Payer: Self-pay | Admitting: Nurse Practitioner

## 2020-02-20 DIAGNOSIS — Z1231 Encounter for screening mammogram for malignant neoplasm of breast: Secondary | ICD-10-CM

## 2020-04-05 ENCOUNTER — Other Ambulatory Visit: Payer: Self-pay | Admitting: Neurology

## 2021-05-16 ENCOUNTER — Encounter (HOSPITAL_COMMUNITY): Payer: Self-pay

## 2021-05-16 ENCOUNTER — Other Ambulatory Visit: Payer: Self-pay

## 2021-05-16 ENCOUNTER — Ambulatory Visit (HOSPITAL_COMMUNITY)
Admission: EM | Admit: 2021-05-16 | Discharge: 2021-05-16 | Disposition: A | Discharge status: Home / Self Care | Payer: BC Managed Care – PPO | Attending: Physician Assistant | Admitting: Physician Assistant

## 2021-05-16 DIAGNOSIS — R350 Frequency of micturition: Secondary | ICD-10-CM

## 2021-05-16 DIAGNOSIS — R300 Dysuria: Secondary | ICD-10-CM | POA: Diagnosis present

## 2021-05-16 DIAGNOSIS — N390 Urinary tract infection, site not specified: Secondary | ICD-10-CM

## 2021-05-16 DIAGNOSIS — R1030 Lower abdominal pain, unspecified: Secondary | ICD-10-CM | POA: Diagnosis present

## 2021-05-16 LAB — POCT URINALYSIS DIPSTICK, ED / UC
Bilirubin Urine: NEGATIVE
Glucose, UA: NEGATIVE mg/dL
Hgb urine dipstick: NEGATIVE
Ketones, ur: NEGATIVE mg/dL
Nitrite: POSITIVE — AB
Protein, ur: NEGATIVE mg/dL
Specific Gravity, Urine: 1.02 (ref 1.005–1.030)
Urobilinogen, UA: 0.2 mg/dL (ref 0.0–1.0)
pH: 7 (ref 5.0–8.0)

## 2021-05-16 MED ORDER — NITROFURANTOIN MONOHYD MACRO 100 MG PO CAPS: 100.0000 mg | ORAL_CAPSULE | Freq: Two times a day (BID) | ORAL | 0 refills | Status: AC

## 2021-05-16 NOTE — ED Triage Notes (Signed)
Pt reports on and off burning sensation when urinating x 1 month. AZO gives relief.

## 2021-05-16 NOTE — ED Provider Notes (Signed)
[facility]    CSN: [id number] Arrival date & time: 05/16/21  1839      History   Chief Complaint Chief Complaint  Patient presents with   Dysuria    HPI Rebecca Shelton is a 55 y.o. female.   Patient presents today with a month-long history of intermittent urinary symptoms.  She has been taking Azo over-the-counter which provided temporary relief of symptoms.  Current episode began approximately 3 to 4 days ago.  Reports dysuria, urinary frequency, urinary urgency, lower abdominal pain.  She denies any changes in bowel movements, fever, vaginal symptoms, hematuria.  She has had urinary tract infections in the past and reports current symptoms are similar to previous episodes of this condition.  She denies any recent antibiotic use.  She has not seen a urologist in the past.  She denies history of single kidney, recent urogenital procedure, self-catheterization, history of nephrolithiasis.   Past Medical History:  Diagnosis Date   Acid reflux    History of migraine 05/30/2012   Irregular menses 05/30/2012   Suspected cervical cancer     Patient Active Problem List   Diagnosis Date Noted   Plantar fasciitis 08/05/2019   Menorrhagia 12/20/2018   Chronic migraine 08/19/2018   Cigarette nicotine dependence without complication 02/15/2017   GERD (gastroesophageal reflux disease) 02/15/2017   Overweight (BMI 25.0-29.9) 02/15/2017   Question Erythema nodosum (outside records) 06/05/2012   History of uterine fibroid 06/05/2012   History of migraine 05/30/2012   Irregular menses 05/30/2012    Past Surgical History:  Procedure Laterality Date   TUBAL LIGATION      OB History   No obstetric history on file.      Home Medications    Prior to Admission medications   Medication Sig Start Date End Date Taking? Authorizing Provider  nitrofurantoin, macrocrystal-monohydrate, (MACROBID) 100 MG capsule Take 1 capsule (100 mg total) by mouth 2 (two) times daily for 7  days. 05/16/21 05/23/21 Yes Lilyth Lawyer, Noberto Retort, PA-C  esomeprazole (NEXIUM) 40 MG capsule Take 40 mg by mouth daily.  09/11/14   [provider]  ondansetron (ZOFRAN-ODT) 4 MG disintegrating tablet Take 1 tablet (4 mg total) by mouth every 8 (eight) hours as needed. Please call [phone] to scheduled appt or may request future refills from PCP. 01/11/20   Levert Feinstein, MD  rizatriptan (MAXALT-MLT) 10 MG disintegrating tablet Take 1 tab at onset of migraine.  May repeat in 2 hrs, if needed.  Max dose: 2 tabs/day. This is a 30 day prescription. 09/15/19   Levert Feinstein, MD  topiramate (TOPAMAX) 100 MG tablet Take 1 tablet (100 mg total) by mouth 2 (two) times daily. Please call [phone] to scheduled appt or may request future refills from PCP. 01/11/20   Levert Feinstein, MD    Family History Family History  Problem Relation Age of Onset   Hypertension Mother    Hyperlipidemia Mother    Atrial fibrillation Mother    Hypertension Father    Heart attack Father     Social History Social History   Tobacco Use   Smoking status: Every Day    Types: Cigarettes   Smokeless tobacco: Never   Tobacco comments:    8-10 cigarettes per day  Substance Use Topics   Alcohol use: Yes    Comment: occasional use   Drug use: No     Allergies   Patient has no known allergies.   Review of Systems Review of Systems  Constitutional:  Negative  for activity change, appetite change, fatigue and fever.  Respiratory:  Negative for cough and shortness of breath.   Cardiovascular:  Negative for chest pain.  Gastrointestinal:  Positive for abdominal pain. Negative for diarrhea, nausea and vomiting.  Genitourinary:  Positive for dysuria, frequency and urgency. Negative for flank pain, hematuria, vaginal bleeding, vaginal discharge and vaginal pain.  Musculoskeletal:  Negative for arthralgias and myalgias.  Neurological:  Negative for dizziness, light-headedness and headaches.    Physical Exam Triage Vital  Signs ED Triage Vitals  Enc Vitals Group     BP 05/16/21 1905 127/72     Pulse Rate 05/16/21 1905 75     Resp 05/16/21 1905 18     Temp 05/16/21 1905 98 F (36.7 C)     Temp Source 05/16/21 1905 Oral     SpO2 05/16/21 1905 100 %     Weight --      Height --      Head Circumference --      Peak Flow --      Pain Score 05/16/21 1902 0     Pain Loc --      Pain Edu? --      Excl. in GC? --    No data found.  Updated Vital Signs BP 127/72 (BP Location: Right Arm)   Pulse 75   Temp 98 F (36.7 C) (Oral)   Resp 18   SpO2 100%   Visual Acuity Right Eye Distance:   Left Eye Distance:   Bilateral Distance:    Right Eye Near:   Left Eye Near:    Bilateral Near:     Physical Exam Vitals reviewed.  Constitutional:      General: She is awake. She is not in acute distress.    Appearance: Normal appearance. She is normal weight. She is not ill-appearing.     Comments: Very pleasant female appears stated age in no acute distress sitting comfortably in exam room  HENT:     Head: Normocephalic and atraumatic.  Cardiovascular:     Rate and Rhythm: Normal rate and regular rhythm.     Heart sounds: Normal heart sounds, S1 normal and S2 normal. No murmur heard. Pulmonary:     Effort: Pulmonary effort is normal.     Breath sounds: Normal breath sounds. No wheezing, rhonchi or rales.     Comments: Clear to auscultation bilaterally Abdominal:     General: Bowel sounds are normal.     Palpations: Abdomen is soft.     Tenderness: There is no abdominal tenderness. There is no right CVA tenderness, left CVA tenderness, guarding or rebound.     Comments: Benign abdominal exam.  No CVA tenderness.  No evidence of acute abdomen on physical exam.  Genitourinary:    Comments: Exam deferred Psychiatric:        Behavior: Behavior is cooperative.     UC Treatments / Results  Labs (all labs ordered are listed, but only abnormal results are displayed) Labs Reviewed  POCT URINALYSIS  DIPSTICK, ED / UC - Abnormal; Notable for the following components:      Result Value   Nitrite POSITIVE (*)    Leukocytes,Ua TRACE (*)    All other components within normal limits  URINE CULTURE    EKG   Radiology No results found.  Procedures Procedures (including critical care time)  Medications Ordered in UC Medications - No data to display  Initial Impression / Assessment and Plan / UC Course  I  have reviewed the triage vital signs and the nursing notes.  Pertinent labs & imaging results that were available during my care of the patient were reviewed by me and considered in my medical decision making (see chart for details).      UA obtained showed positive nitrate and leukocyte esterase.  Patient was treated with Macrobid.  Urine culture obtained-results pending.  Discussed potentially change antibiotics based on susceptibilities identified on culture.  She can use over-the-counter medications for symptom relief.  Recommend she rest and drink plenty of fluids.  Discussed that if she has any worsening symptoms she is to be reevaluated.  Strict return precautions given to which she expressed understanding.  Discussed that if symptoms continue to recur even after treatment and urine culture shows sensitivity to Macrobid she should follow-up with urologist was given contact information for local group.  Final Clinical Impressions(s) / UC Diagnoses   Final diagnoses:  Lower urinary tract infectious disease  Lower abdominal pain  Dysuria  Urinary frequency     Discharge Instructions      Your urine looks like you have a urinary tract infection.  We are starting you on an antibiotic known as Macrobid that you will take twice a day for 7 days.  We will send your urine off for culture and if this grows a bacteria that is resistant to Macrobid we will contact you to change the antibiotic.  Make sure you are drinking plenty of fluid.  If your symptoms or not improving readily  with medication I would recommend you follow-up with the urologist.  If you develop any fever, blood in your urine, severe abdominal pain, nausea/vomiting interfering with eating and drinking you need to be seen immediately.     ED Prescriptions     Medication Sig Dispense Auth. Provider   nitrofurantoin, macrocrystal-monohydrate, (MACROBID) 100 MG capsule Take 1 capsule (100 mg total) by mouth 2 (two) times daily for 7 days. 14 capsule Adelaida Reindel K, PA-C      PDMP not reviewed this encounter.   Jeani Hawking, PA-C 05/16/21 1933

## 2021-05-16 NOTE — Discharge Instructions (Signed)
Your urine looks like you have a urinary tract infection.  We are starting you on an antibiotic known as Macrobid that you will take twice a day for 7 days.  We will send your urine off for culture and if this grows a bacteria that is resistant to Macrobid we will contact you to change the antibiotic.  Make sure you are drinking plenty of fluid.  If your symptoms or not improving readily with medication I would recommend you follow-up with the urologist.  If you develop any fever, blood in your urine, severe abdominal pain, nausea/vomiting interfering with eating and drinking you need to be seen immediately.

## 2021-05-18 LAB — URINE CULTURE
